# Patient Record
Sex: Male | Born: 2000
Health system: Southern US, Community
[De-identification: ages and names within clinical notes are randomized; demographics above are authoritative.]

---
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
## 2021-12-08 IMAGING — CR DG CHEST 2V
2 series · 2 of 2 positions shown · non-contrast
Comparison: None.

CLINICAL DATA: Chest pain

EXAM:
CHEST - 2 VIEW

[w chest pa]
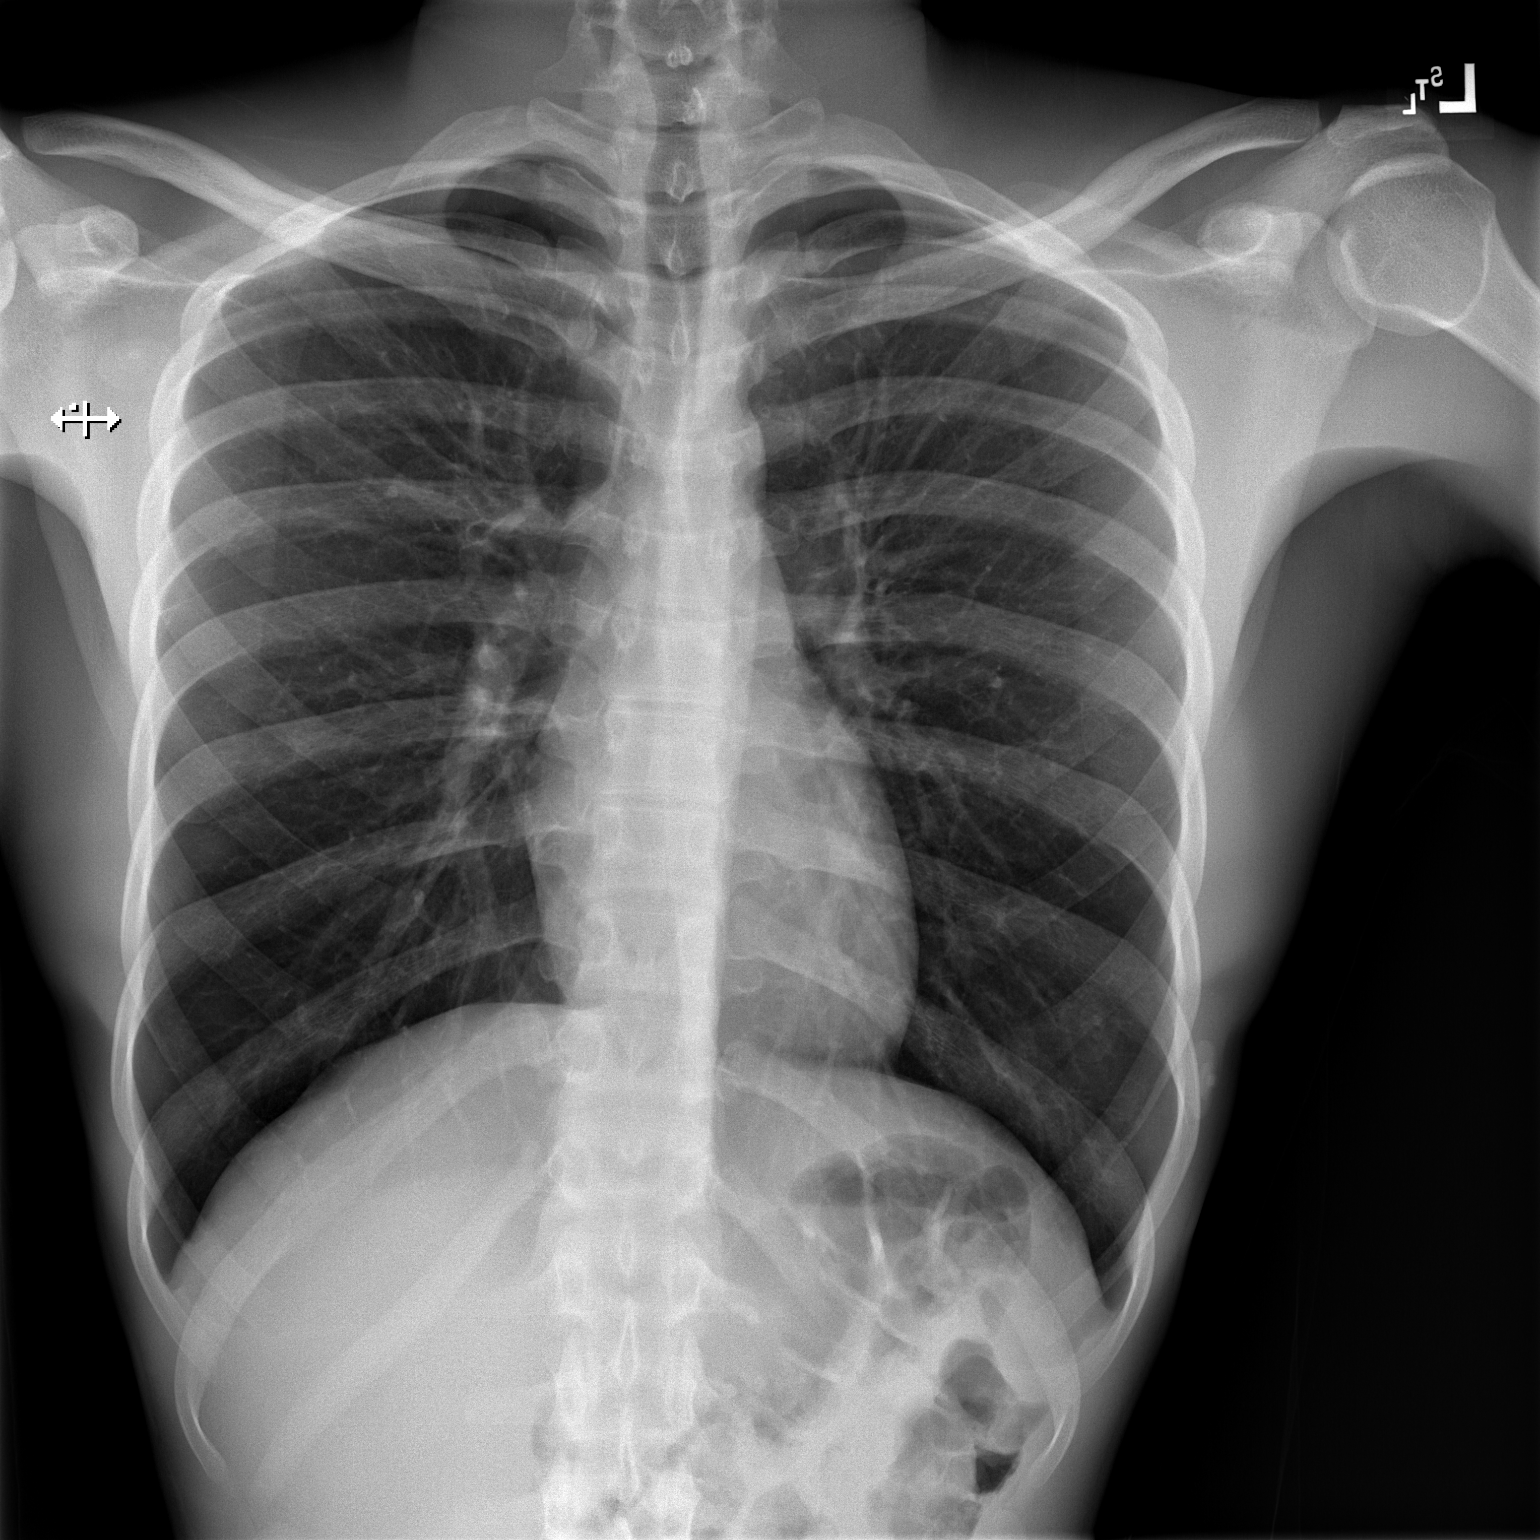

[w chest lat]
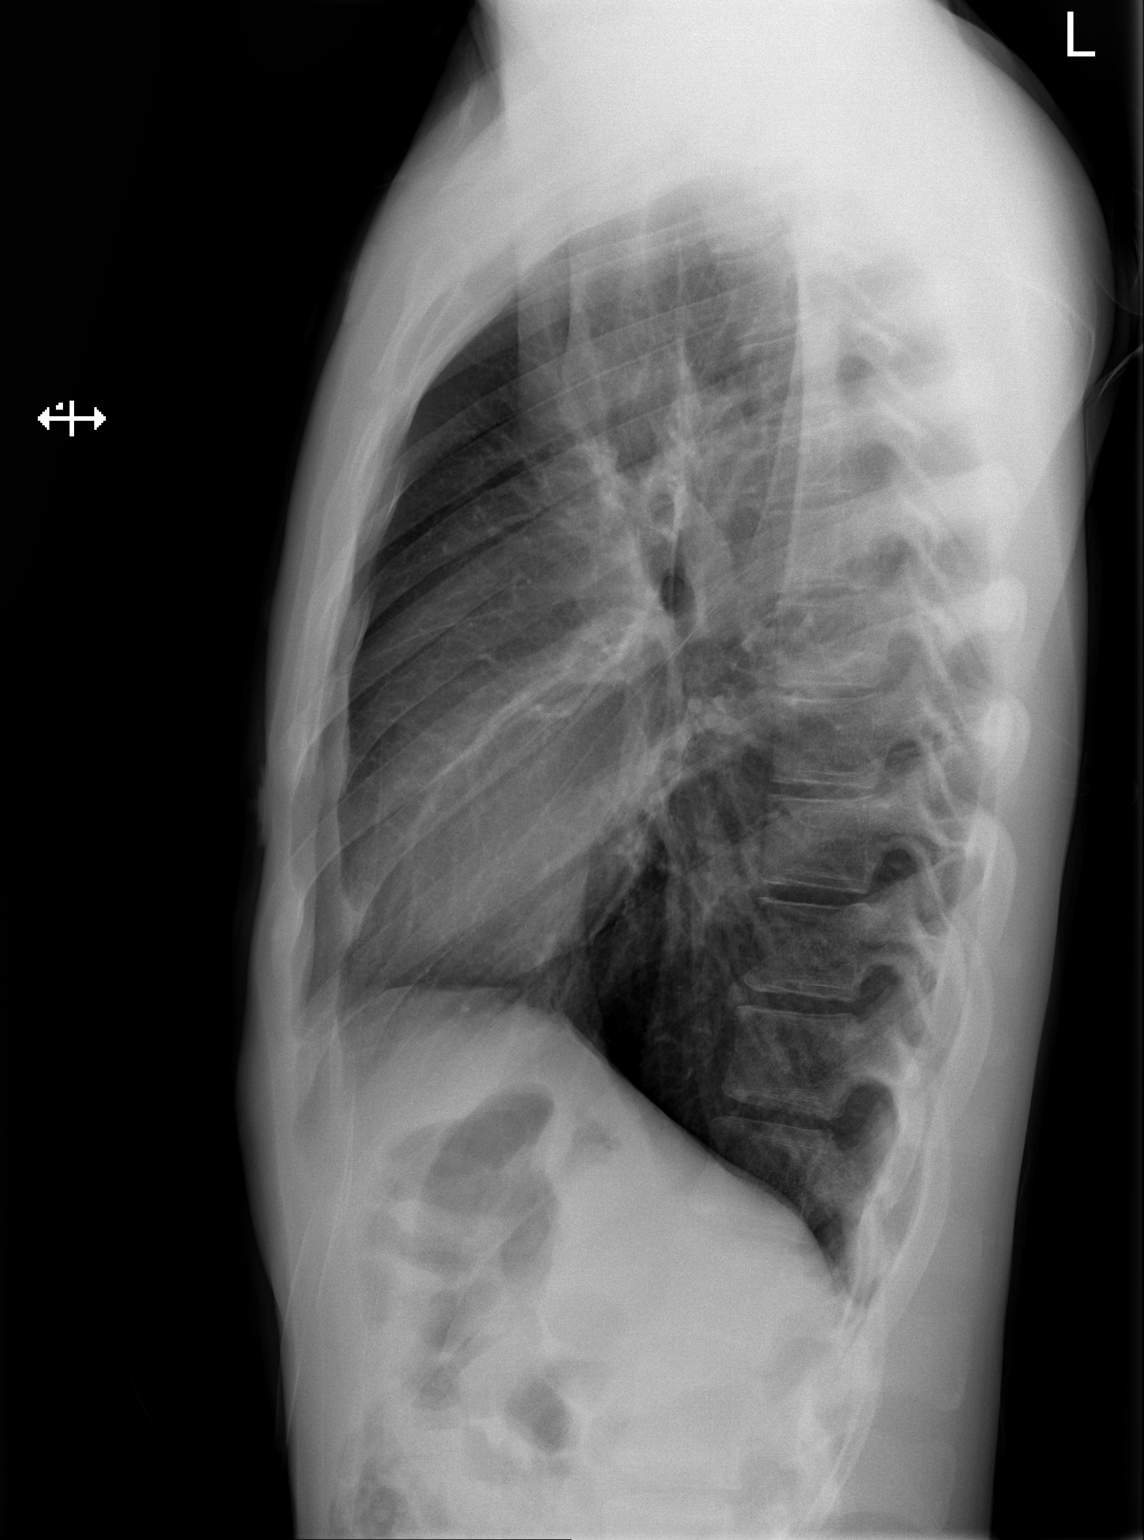

[2 of 2 positions shown; findings below may reference images not displayed]

FINDINGS: Lungs are clear. Heart size and pulmonary vascularity are normal. No
adenopathy. No pneumothorax or pneumomediastinum. No bone lesions.
IMPRESSION: No abnormality noted.
